# Patient Record
Sex: Male | Born: 1990 | Race: White | Hispanic: No | Smoking: Current every day smoker
Health system: Southern US, Community
[De-identification: ages and names within clinical notes are randomized; demographics above are authoritative.]

## PROBLEM LIST (undated history)

## (undated) DIAGNOSIS — J45909 Unspecified asthma, uncomplicated: Secondary | ICD-10-CM

## (undated) HISTORY — PX: HERNIA REPAIR: SHX51

## (undated) HISTORY — PX: BUNIONECTOMY: SHX129

---
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
## 2016-03-02 DIAGNOSIS — Z139 Encounter for screening, unspecified: Secondary | ICD-10-CM

## 2016-07-03 NOTE — Congregational Nurse Program (Signed)
Congregational Nurse Program Note  Date of Encounter: 03/02/2016  Past Medical History: No past medical history on file.  Encounter Details:  Client was [facility] at [facility] with multiple issues. Client was assisted with appoinment at [facility] to establish PCP, needs physical. Information given to client regarding [facility] to be evaluated. Referral form was done for [facility], client to call if wants an appoinment. Informed [facility] of [facility] 03/06/16.  Hewitt ShortsJan Tamiyah Moulin, RN CNP [phone]

## 2021-07-23 ENCOUNTER — Encounter (HOSPITAL_COMMUNITY): Payer: Self-pay

## 2021-07-23 ENCOUNTER — Emergency Department (HOSPITAL_COMMUNITY): Payer: Self-pay

## 2021-07-23 ENCOUNTER — Emergency Department (HOSPITAL_COMMUNITY)
Admission: EM | Admit: 2021-07-23 | Discharge: 2021-07-23 | Disposition: A | Discharge status: Home / Self Care | Payer: Self-pay | Attending: Emergency Medicine | Admitting: Emergency Medicine

## 2021-07-23 ENCOUNTER — Other Ambulatory Visit: Payer: Self-pay

## 2021-07-23 DIAGNOSIS — F17210 Nicotine dependence, cigarettes, uncomplicated: Secondary | ICD-10-CM | POA: Insufficient documentation

## 2021-07-23 DIAGNOSIS — M545 Low back pain, unspecified: Secondary | ICD-10-CM | POA: Insufficient documentation

## 2021-07-23 DIAGNOSIS — J45909 Unspecified asthma, uncomplicated: Secondary | ICD-10-CM | POA: Insufficient documentation

## 2021-07-23 DIAGNOSIS — M5442 Lumbago with sciatica, left side: Secondary | ICD-10-CM

## 2021-07-23 HISTORY — DX: Unspecified asthma, uncomplicated: J45.909

## 2021-07-23 LAB — URINALYSIS, ROUTINE W REFLEX MICROSCOPIC
Bilirubin Urine: NEGATIVE
Glucose, UA: NEGATIVE mg/dL
Hgb urine dipstick: NEGATIVE
Ketones, ur: NEGATIVE mg/dL
Leukocytes,Ua: NEGATIVE
Nitrite: NEGATIVE
Protein, ur: NEGATIVE mg/dL
Specific Gravity, Urine: 1.024 (ref 1.005–1.030)
pH: 6 (ref 5.0–8.0)

## 2021-07-23 MED ORDER — METHOCARBAMOL 500 MG PO TABS
500.0000 mg | ORAL_TABLET | Freq: Once | ORAL | Status: AC
  Administered 2021-07-23: 500 mg via ORAL
  Filled 2021-07-23: qty 1

## 2021-07-23 MED ORDER — METHOCARBAMOL 500 MG PO TABS: 500.0000 mg | ORAL_TABLET | Freq: Three times a day (TID) | ORAL | 0 refills | Status: AC

## 2021-07-23 MED ORDER — TRAMADOL HCL 50 MG PO TABS: 50.0000 mg | ORAL_TABLET | Freq: Four times a day (QID) | ORAL | 0 refills | Status: AC | PRN

## 2021-07-23 MED ORDER — HYDROCODONE-ACETAMINOPHEN 5-325 MG PO TABS
1.0000 | ORAL_TABLET | Freq: Once | ORAL | Status: AC
  Administered 2021-07-23: 1 via ORAL
  Filled 2021-07-23: qty 1

## 2021-07-23 NOTE — Discharge Instructions (Signed)
Try alternating ice and heat to your lower back.  Avoid heavy lifting, twisting or bending movements for at least 1 week.  Call your primary care provider or you may contact the orthopedic provider or clinic listed above to arrange follow-up.  Return to the emergency department for any new or worsening symptoms.

## 2021-07-23 NOTE — ED Provider Notes (Signed)
[facility] EMERGENCY DEPARTMENT Provider Note   CSN: [id number] Arrival date & time: 07/23/21  1155     History Chief Complaint  Patient presents with   Back Pain    Jeff Cook is a 30 y.o. male.   Back Pain Associated symptoms: no abdominal pain, no chest pain, no dysuria, no fever, no numbness and no weakness         Jeff Cook is a 30 y.o. male who presents to the Emergency Department complaining of low back pain x2 weeks.  He states that he went to [facility] and was treated for low back pain with naproxen and Flexeril.  Initially medications were helping, but now states the pain has worsened.  He describes aching pain across his lower back that radiates into both hips and thighs.  Pain is worse with walking, standing, or certain positions.  Improves at rest.  No known injuries.  He does endorse heavy lifting at work.  He denies fever, chills, urine or bowel changes, abdominal pain, pain to his testicles and numbness or weakness of his lower extremities.  No recent spinal procedures.   Past Medical History:  Diagnosis Date   Asthma     There are no problems to display for this patient.   Past Surgical History:  Procedure Laterality Date   BUNIONECTOMY     twice   HERNIA REPAIR         History reviewed. No pertinent family history.  Social History   Tobacco Use   Smoking status: Every Day    Packs/day: 0.50    Types: Cigarettes   Smokeless tobacco: Never  Vaping Use   Vaping Use: Every day  Substance Use Topics   Alcohol use: Yes   Drug use: Never    Home Medications Prior to Admission medications   Not on File    Allergies    Patient has no known allergies.  Review of Systems   Review of Systems  Constitutional:  Negative for chills, fatigue and fever.  Respiratory:  Negative for cough and shortness of breath.   Cardiovascular:  Negative for chest pain and palpitations.  Gastrointestinal:  Negative for abdominal pain,  constipation, nausea and vomiting.  Genitourinary:  Negative for decreased urine volume, difficulty urinating, dysuria, flank pain, hematuria and testicular pain.  Musculoskeletal:  Positive for back pain. Negative for arthralgias, joint swelling, myalgias, neck pain and neck stiffness.  Skin:  Negative for rash.  Neurological:  Negative for dizziness, weakness and numbness.  Hematological:  Does not bruise/bleed easily.   Physical Exam Updated Vital Signs BP 101/68 (BP Location: Right Arm)   Pulse (!) 102   Temp 99.1 F (37.3 C) (Oral)   Resp 16   Ht 6\' 4"  (1.93 m)   Wt 77.1 kg   SpO2 99%   BMI 20.69 kg/m   Physical Exam Vitals and nursing note reviewed.  Constitutional:      Appearance: Normal appearance.  HENT:     Head: Normocephalic.  Eyes:     Pupils: Pupils are equal, round, and reactive to light.  Neck:     Thyroid: No thyromegaly.     Meningeal: Kernig's sign absent.  Cardiovascular:     Rate and Rhythm: Normal rate and regular rhythm.     Heart sounds: Normal heart sounds.  Pulmonary:     Effort: Pulmonary effort is normal.     Breath sounds: Normal breath sounds. No wheezing.  Abdominal:     Palpations:  Abdomen is soft.     Tenderness: There is no abdominal tenderness. There is no guarding or rebound.  Musculoskeletal:        General: Tenderness present. No swelling. Normal range of motion.     Cervical back: Normal range of motion and neck supple.     Comments: Diffuse tenderness to palpation of the lower lumbar and bilateral paraspinal muscles.  Hip flexors and extensors are intact.  Skin:    General: Skin is warm.     Capillary Refill: Capillary refill takes less than 2 seconds.     Findings: No rash.  Neurological:     Mental Status: He is alert and oriented to person, place, and time.     Sensory: No sensory deficit.     Motor: No weakness.    ED Results / Procedures / Treatments   Labs (all labs ordered are listed, but only abnormal results are  displayed) Labs Reviewed  URINALYSIS, ROUTINE W REFLEX MICROSCOPIC - Abnormal; Notable for the following components:      Result Value   APPearance HAZY (*)    All other components within normal limits    EKG None  Radiology DG Lumbar Spine Complete  Result Date: 07/23/2021 CLINICAL DATA:  Low back pain with pain radiating down left thigh for 2 weeks. No known injury. EXAM: LUMBAR SPINE - COMPLETE 4+ VIEW COMPARISON:  None. FINDINGS: Question 6 non-rib-bearing lumbar vertebral bodies. There is no evidence of lumbar spine fracture. Alignment is normal. Intervertebral disc spaces are maintained. Stool throughout the visualized colon. IMPRESSION: No acute displaced fracture or traumatic listhesis of the lumbar spine. Electronically Signed   By: Tish Frederickson M.D.   On: 07/23/2021 17:21    Procedures Procedures   Medications Ordered in ED Medications - No data to display  ED Course  I have reviewed the triage vital signs and the nursing notes.  Pertinent labs & imaging results that were available during my care of the patient were reviewed by me and considered in my medical decision making (see chart for details).    MDM Rules/Calculators/A&P                          Patient here with 2-week history of low back pain.  No known injury but does endorse heavy lifting at his job.  Pain is worse with position change, standing and walking.  Improves at rest.  No history of drug use or recent procedures to suggest spinal abscess or discitis.  On exam, patient has diffuse tenderness palpation of the lumbar paraspinal muscles.  Able to perform straight leg raise bilaterally.  5 out of 5 strength bilateral lower extremities.  He is ambulatory to the restroom without difficulty.  No significant ataxia.  Urinalysis without evidence of infection.  X-ray without acute bony findings, does show increased amount of stool throughout the colon.  Doubt emergent process, specifically no red flags  suggestive of cauda equina or infectious process.  Discussed x-ray findings and urine result.  Patient appears appropriate for discharge home, agrees to over-the-counter MiraLAX 1-2 times daily until constipation improves and he will follow-up with PCP or orthopedics.  Return precautions were discussed.   Final Clinical Impression(s) / ED Diagnoses Final diagnoses:  Acute bilateral low back pain with bilateral sciatica    Rx / DC Orders ED Discharge Orders     None        Pauline Aus, PA-C 07/23/21 1842  Cheryll Cockayne, MD 08/13/21 [phone]

## 2021-07-23 NOTE — ED Triage Notes (Signed)
Pt to er, pt states that about two weeks ago pt went to [facility] for back pain, states that he was given naproxen and flexeril and he is here because the pain is too bad to go to work [employer].

## 2022-03-05 ENCOUNTER — Emergency Department (HOSPITAL_COMMUNITY): Payer: Self-pay

## 2022-03-05 ENCOUNTER — Other Ambulatory Visit: Payer: Self-pay

## 2022-03-05 ENCOUNTER — Encounter (HOSPITAL_COMMUNITY): Payer: Self-pay | Admitting: Emergency Medicine

## 2022-03-05 ENCOUNTER — Emergency Department (HOSPITAL_COMMUNITY)
Admission: EM | Admit: 2022-03-05 | Discharge: 2022-03-05 | Disposition: A | Discharge status: Home / Self Care | Payer: Self-pay | Attending: Emergency Medicine | Admitting: Emergency Medicine

## 2022-03-05 DIAGNOSIS — R200 Anesthesia of skin: Secondary | ICD-10-CM | POA: Insufficient documentation

## 2022-03-05 DIAGNOSIS — M25551 Pain in right hip: Secondary | ICD-10-CM | POA: Insufficient documentation

## 2022-03-05 LAB — CBG MONITORING, ED: Glucose-Capillary: 89 mg/dL (ref 70–99)

## 2022-03-05 MED ORDER — KETOROLAC TROMETHAMINE 60 MG/2ML IM SOLN
60.0000 mg | Freq: Once | INTRAMUSCULAR | Status: AC
  Administered 2022-03-05: 60 mg via INTRAMUSCULAR
  Filled 2022-03-05: qty 2

## 2022-03-05 NOTE — ED Triage Notes (Signed)
Pt to the ED with bilateral hip for the last month. ? ?Pt states he now has rt leg numbness from his shin area that began in his toes.  ?

## 2022-03-05 NOTE — ED Provider Notes (Signed)
?[facility] EMERGENCY DEPARTMENT ?Provider Note ? ? ?CSN: [id number] ?Arrival date & time: 03/05/22  6948 ? ?  ? ?History ? ?Chief Complaint  ?Patient presents with  ? Leg Pain  ? ? ?Jeff Cook is a 31 y.o. male. ? ? ?Leg Pain ? ?Patient presents with right hip pain times month and a half.  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  He states the right hip pain has been constant, sharp and worse with any ambulation or movement.  No associated saddle anesthesia, bilateral lower extremity weakness, fecal retention or incontinence.  Denies any dysuria or hematuria. ? ?Patient states starting a week ago he started having right lower extremity numbness from the toes to the knee which is constant.  He states he has decreased sensation, he still feels pressure.  No pain to the calf, no shortness of breath or chest pain, no history of DVTs. ? ?No previous spinal surgeries.  He is not a known diabetic.  Denies any history of IV drug use or immunosuppression therapy. ? ?Home Medications ?Prior to Admission medications   ?Medication Sig Start Date End Date Taking? Authorizing Provider  ?methocarbamol (ROBAXIN) 500 MG tablet Take 1 tablet (500 mg total) by mouth 3 (three) times daily. ?Patient not taking: Reported on 03/05/2022 07/23/21   Pauline Aus, PA-C  ?traMADol (ULTRAM) 50 MG tablet Take 1 tablet (50 mg total) by mouth every 6 (six) hours as needed. ?Patient not taking: Reported on 03/05/2022 07/23/21   Pauline Aus, PA-C  ?   ? ?Allergies    ?Patient has no known allergies.   ? ?Review of Systems   ?Review of Systems ? ?Physical Exam ?Updated Vital Signs ?BP 119/84   Pulse 95   Temp 97.8 ?F (36.6 ?C) (Oral)   Resp 16   Ht 6\' 4"  (1.93 m)   Wt 77.1 kg   SpO2 100%   BMI 20.69 kg/m?  ?Physical Exam ?Vitals and nursing note reviewed. Exam conducted with a chaperone present.  ?Constitutional:   ?   General: He is not in acute distress. ?   Appearance:  Normal appearance.  ?HENT:  ?   Head: Normocephalic and atraumatic.  ?Eyes:  ?   General: No scleral icterus. ?   Extraocular Movements: Extraocular movements intact.  ?   Pupils: Pupils are equal, round, and reactive to light.  ?Cardiovascular:  ?   Pulses: Normal pulses.  ?Musculoskeletal:     ?   General: Tenderness present.  ?   Comments: Point tenderness to the right hip and decreased ROM with pain with straight leg extension.  No midline lumbar tenderness, no paraspinal tenderness.  ?Skin: ?   Capillary Refill: Capillary refill takes less than 2 seconds.  ?   Coloration: Skin is not jaundiced.  ?   Comments: No erythema or swelling to the lower extremities.  ?Neurological:  ?   Mental Status: He is alert. Mental status is at baseline.  ?   Coordination: Coordination normal.  ?   Comments: Cranial nerves III through XII are grossly intact.  Able to raise both lower extremities, plantarflexion and dorsiflexion 5/5 strength.  Patellar and Achilles reflexes fully intact.  ? ? ?ED Results / Procedures / Treatments   ?Labs ?(all labs ordered are listed, but only abnormal results are displayed) ?Labs Reviewed  ?CBG MONITORING, ED  ? ? ?EKG ?None ? ?Radiology ?CT Hip Right Wo Contrast ? ?Result Date:  03/05/2022 ?CLINICAL DATA:  Right hip pain for 1 month.  No known injury. EXAM: CT OF THE RIGHT HIP WITHOUT CONTRAST TECHNIQUE: Multidetector CT imaging of the right hip was performed according to the standard protocol. Multiplanar CT image reconstructions were also generated. RADIATION DOSE REDUCTION: This exam was performed according to the departmental dose-optimization program which includes automated exposure control, adjustment of the mA and/or kV according to patient size and/or use of iterative reconstruction technique. COMPARISON:  None. FINDINGS: Bones/Joint/Cartilage No acute fracture. No dislocation. No focal cortical thickening or periosteal elevation. Right hip joint space is preserved. No hip joint effusion.  No evidence of femoral head avascular necrosis. No lytic or sclerotic bone lesion. Included portion of the right hemipelvis is intact. Pubic symphysis and right sacroiliac joint within normal limits. Ligaments Suboptimally assessed by CT. Muscles and Tendons Musculotendinous structures appear within normal limits by CT. Soft tissues No soft tissue swelling or fluid collection. No right inguinal lymphadenopathy. No acute findings within the visualized right hemipelvis. Normal appendix is seen. IMPRESSION: Negative CT of the right hip. Electronically Signed   By: Davina Poke D.O.   On: 03/05/2022 11:36   ? ?Procedures ?Procedures  ? ? ?Medications Ordered in ED ?Medications  ?ketorolac (TORADOL) injection 60 mg (has no administration in time range)  ? ? ?ED Course/ Medical Decision Making/ A&P ?  ?                        ?Medical Decision Making ?Amount and/or Complexity of Data Reviewed ?Radiology: ordered. ? ?Risk ?Prescription drug management. ? ? ?Patient presents with right hip pain and right lower extremity numbness.  Physical exam is overall reassuring, his vitals are stable.  He is not febrile, he is neurovascularly intact with equal reflexes to patellar and Achilles.  I considered possible DVT but there is no lower extremity tenderness, swelling, erythema.  Although he does have subjective numbness, he has full sensation on exam.  There are no focal deficits, he is able to raise both lower extremities without any difficulties.  I considered septic joint but feel that this is unlikely.  Considered cauda equina but in the absence of trauma also doubt.  Considered epidural abscess but patient does not have a history of IV drug use or immunocompromise state, additionally he is not febrile and his neuro exam is overall unremarkable.  Considered neuropathy, patient is not diabetic and I checked a CBG which showed sugar of 89. ? ?Patient presentation is not consistent with sciatica given the numbness is from  toe to calf.  He has point tenderness to the right hip so I did proceed with CT given patient does not have outpatient follow-up easily available and he has had a long chronicity of the symptoms with subjective worsening. ? ?I viewed the CT image of his right hip and agree with radiologist interpretation.  Do not see any acute process.  I ordered the patient some Toradol which helped improve his pain.  He said this was likely musculoskeletal, although he does have some subjective numbness it does not seem consistent with paresthesias, there is no difficulty with strength, objective sensation on exam, DP or PT or reflexes.  Do not think he needs any additional work-up at this time.  We will provide referral for orthopedic follow-up as needed outpatient. ? ? ? ? ? ? ? ?Final Clinical Impression(s) / ED Diagnoses ?Final diagnoses:  ?Pain of right hip  ? ? ?Rx / DC  Orders ?ED Discharge Orders   ? ? None  ? ?  ? ? ?  ?Sherrill Raring, PA-C ?03/05/22 1146 ? ?  ?Milton Ferguson, MD ?03/05/22 1727 ? ?

## 2022-03-05 NOTE — Discharge Instructions (Addendum)
Work-up today was reassuring.  You are not diabetic.  Follow-up with orthopedic surgery for reevaluation if symptoms persist.   ? ?Return to the ED if you are unable to urinate or have bowel movements, you have bilateral weakness, or you lose sensation in your pelvic area. ?

## 2022-04-08 ENCOUNTER — Ambulatory Visit (INDEPENDENT_AMBULATORY_CARE_PROVIDER_SITE_OTHER): Payer: Self-pay

## 2022-04-08 ENCOUNTER — Ambulatory Visit (INDEPENDENT_AMBULATORY_CARE_PROVIDER_SITE_OTHER): Payer: Self-pay | Admitting: Orthopaedic Surgery

## 2022-04-08 ENCOUNTER — Encounter: Payer: Self-pay | Admitting: Orthopaedic Surgery

## 2022-04-08 VITALS — Ht 76.0 in | Wt 170.0 lb

## 2022-04-08 DIAGNOSIS — G8929 Other chronic pain: Secondary | ICD-10-CM

## 2022-04-08 DIAGNOSIS — M5441 Lumbago with sciatica, right side: Secondary | ICD-10-CM

## 2022-04-08 DIAGNOSIS — M5440 Lumbago with sciatica, unspecified side: Secondary | ICD-10-CM

## 2022-04-08 DIAGNOSIS — M545 Low back pain, unspecified: Secondary | ICD-10-CM | POA: Insufficient documentation

## 2022-04-08 MED ORDER — METHYLPREDNISOLONE 4 MG PO TBPK: ORAL_TABLET | ORAL | 0 refills | Status: AC

## 2022-04-08 NOTE — Progress Notes (Signed)
Office Visit Note   Patient: Jeff Cook           Date of Birth: [birth year]           MRN: [id number] Visit Date: 04/08/2022              Requested by: No referring provider defined for this encounter. PCP: Pcp, No   Assessment & Plan: Visit Diagnoses:  1. Chronic right-sided low back pain with right-sided sciatica   2. Acute right-sided low back pain with sciatica, sciatica laterality unspecified     Plan: Patient is a pleasant 31 year old gentleman with a 21-month history of right leg pain that radiates down to his ankle.  He denies any injury.  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  He says the tables are low compared to his height.  He thinks this may have caused aggravation.  The pain starts in his right buttock and radiates down the back of his leg to his foot with some toe numbness and calf numbness.  He has been taking 12 ibuprofen a day which helps a little bit.  No pain with coughing or sneezing.  Findings consistent with radicular lower back symptoms going down his right side.  Certainly the definitive test would be to get an MRI but this may be difficult as he does not have any insurance.  We will try a course of Medrol Dosepak he was told not to take the ibuprofen with this also have given him a home exercise program.  If he does not get long-lasting relief we would again recommend an MRI  Follow-Up Instructions: No follow-ups on file.   Orders:  Orders Placed This Encounter  Procedures   XR Lumbar Spine 2-3 Views   Meds ordered this encounter  Medications   methylPREDNISolone (MEDROL DOSEPAK) 4 MG TBPK tablet    Sig: Take as directed on package    Dispense:  21 tablet    Refill:  0      Procedures: No procedures performed   Clinical Data: No additional findings.   Subjective: Chief Complaint  Patient presents with   Lower Back - Pain  Patient presents today for right leg pain. He said that it started back in  February of this year. No known injury. He has pain that originates in his buttock area and radiates down the backside of his leg to his foot. He has numbness in his calf and toes. He states that he feels better with lying down and has no trouble sleeping at night. He has increased pain with any movement. He is taking Ibuprofen and states that it helps.     Review of Systems  All other systems reviewed and are negative.   Objective: Vital Signs: Ht 6\' 4"  (1.93 m)   Wt 170 lb (77.1 kg)   BMI 20.69 kg/m   Physical Exam Pulmonary:     Effort: Pulmonary effort is normal.  Skin:    General: Skin is warm and dry.  Neurological:     Mental Status: He is alert.  Psychiatric:        Mood and Affect: Mood normal.        Behavior: Behavior normal.    Ortho Exam Examination of his lower back he has 5 out of 5 strength bilaterally with dorsiflexion plantarflexion of his ankles resisted flexion of his hips resisted extension and flexion of his legs.  He has a  negative straight leg raise bilaterally..  Deep tendon reflexes are 2+ and equivalent to the left side. Specialty Comments:  No specialty comments available.  Imaging: No results found.   PMFS History: Patient Active Problem List   Diagnosis Date Noted   Low back pain 04/08/2022   Past Medical History:  Diagnosis Date   Asthma     History reviewed. No pertinent family history.  Past Surgical History:  Procedure Laterality Date   BUNIONECTOMY     twice   HERNIA REPAIR     Social History   Occupational History   Not on file  Tobacco Use   Smoking status: Every Day    Packs/day: 0.50    Types: Cigarettes   Smokeless tobacco: Never  Vaping Use   Vaping Use: Every day  Substance and Sexual Activity   Alcohol use: Yes    Alcohol/week: 1.0 standard drink    Types: 1 Shots of liquor per week    Comment: occionally   Drug use: Never   Sexual activity: Not on file

## 2022-10-03 IMAGING — CT CT HIP*R* W/O CM
2 of 5 series · 16 of 46 positions shown, 19 images · non-contrast
Comparison: None.

CLINICAL DATA: Right hip pain for 1 month.  No known injury.

EXAM:
CT OF THE RIGHT HIP WITHOUT CONTRAST
TECHNIQUE: Multidetector CT imaging of the right hip was performed according to
the standard protocol. Multiplanar CT image reconstructions were
also generated.
RADIATION DOSE REDUCTION: This exam was performed according to the
departmental dose-optimization program which includes automated
exposure control, adjustment of the mA and/or kV according to
patient size and/or use of iterative reconstruction technique.

[Series 5: 3 axial soft · axial · 0.49mm/px · z∈[+881,+1081]mm · 13 of 112 slices shown, 16 images]
[im 8/112  soft-tissue]
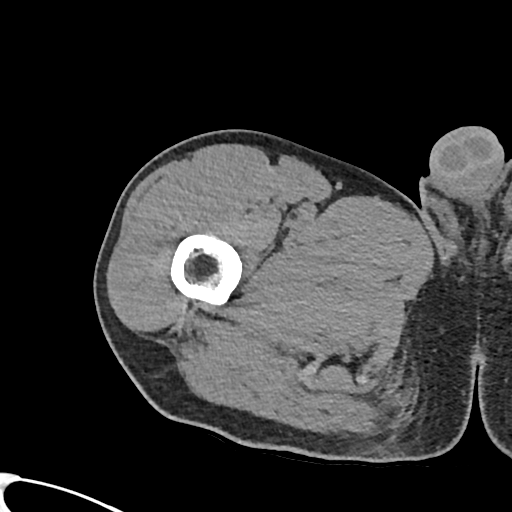
[im 8/112  bone]
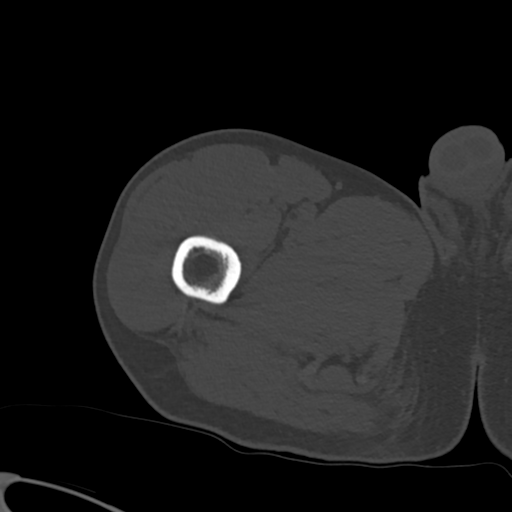
[im 18/112  soft-tissue]
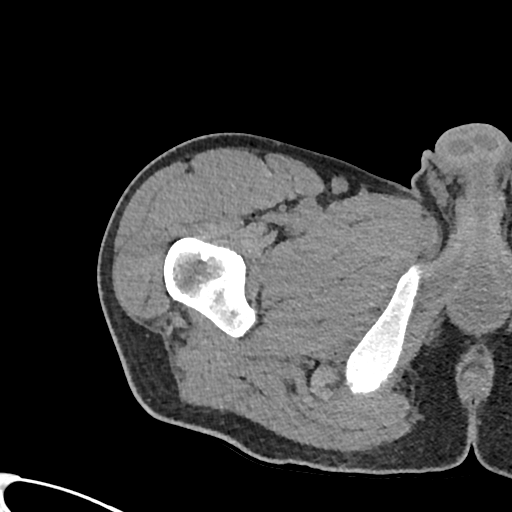
[im 29/112  soft-tissue]
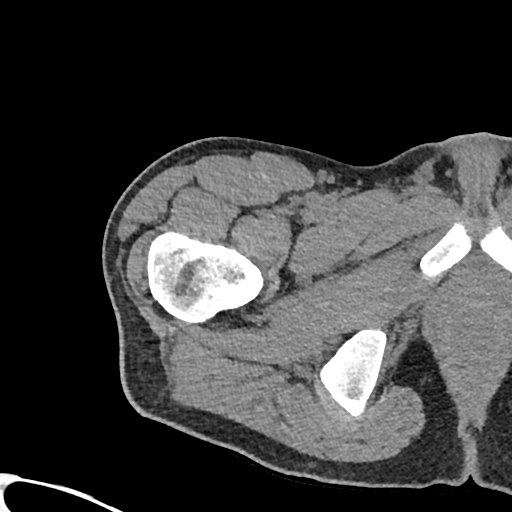
[im 40/112  soft-tissue]
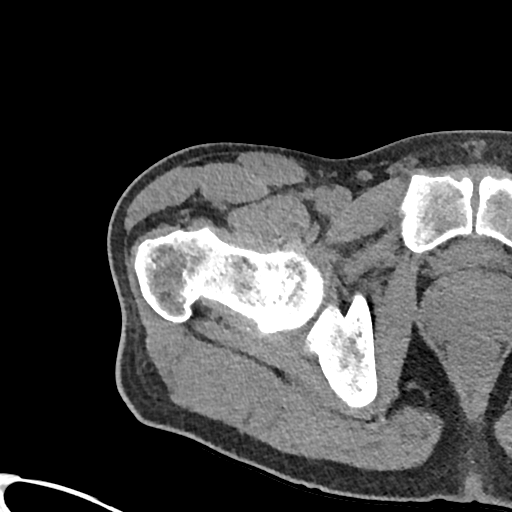
[im 51/112  soft-tissue]
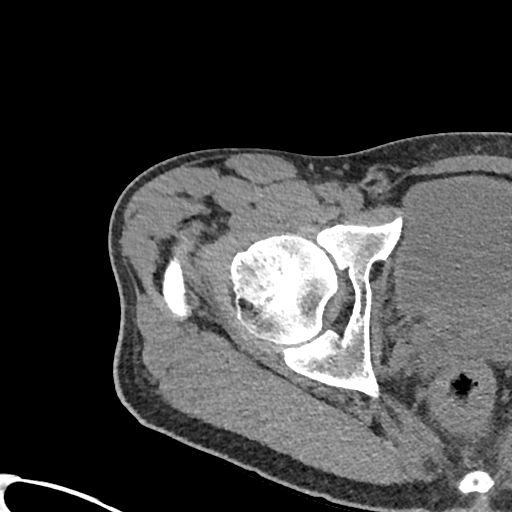
[im 61/112  soft-tissue]
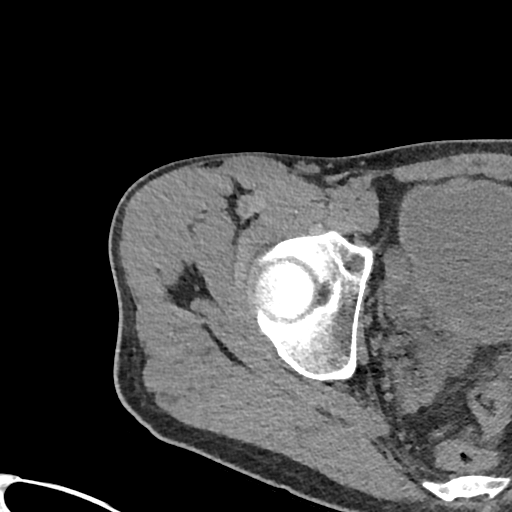
[im 72/112  soft-tissue]
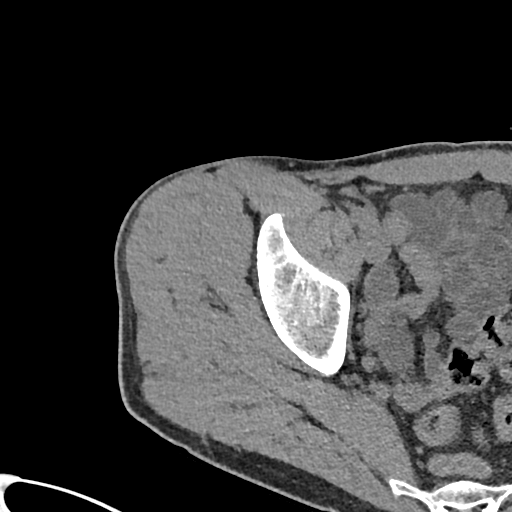
[im 83/112  soft-tissue]
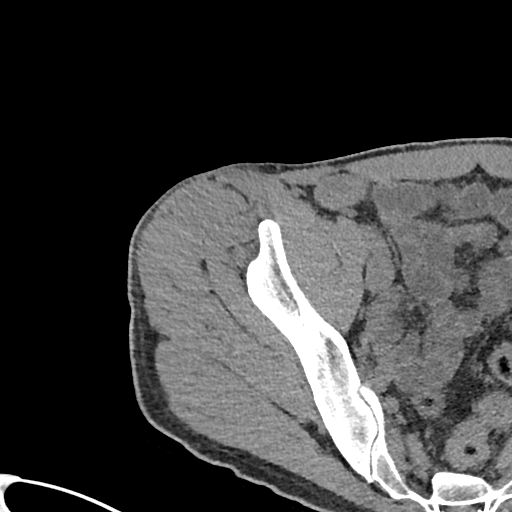
[im 94/112  soft-tissue]
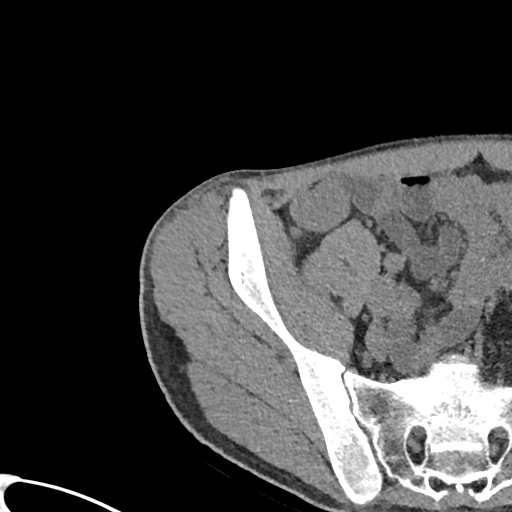
[im 94/112  bone]
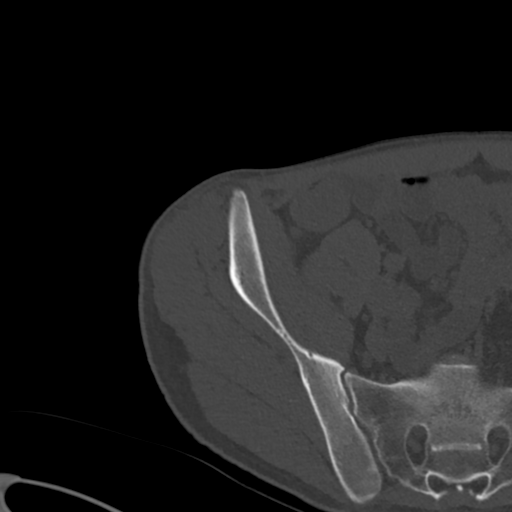
[im 97/112  lung]
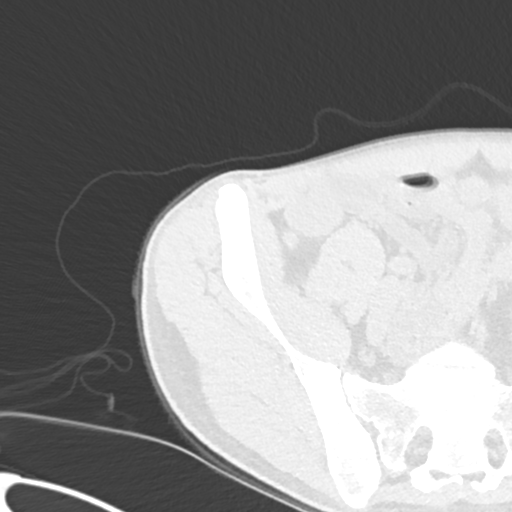
[im 101/112  lung]
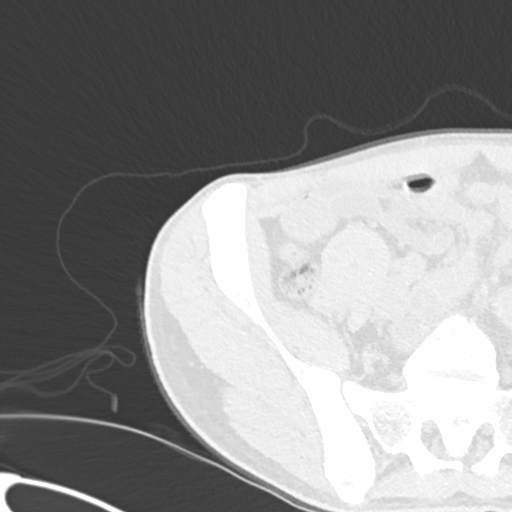
[im 104/112  soft-tissue]
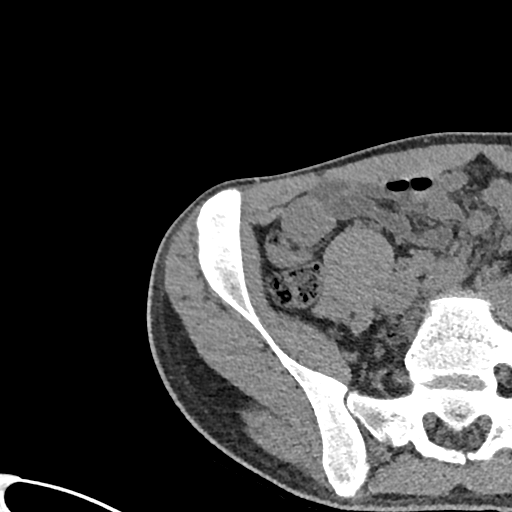
[im 104/112  lung]
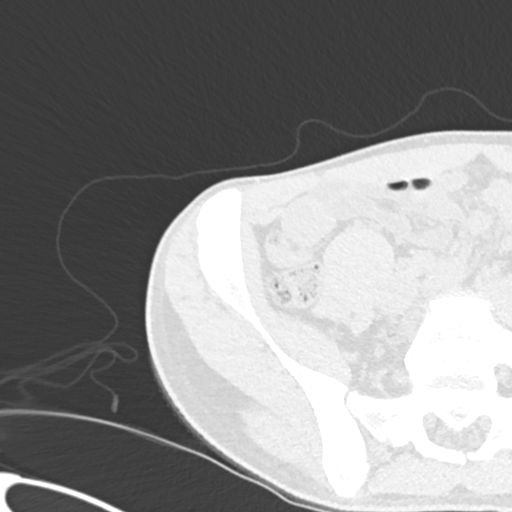
[im 108/112  lung]
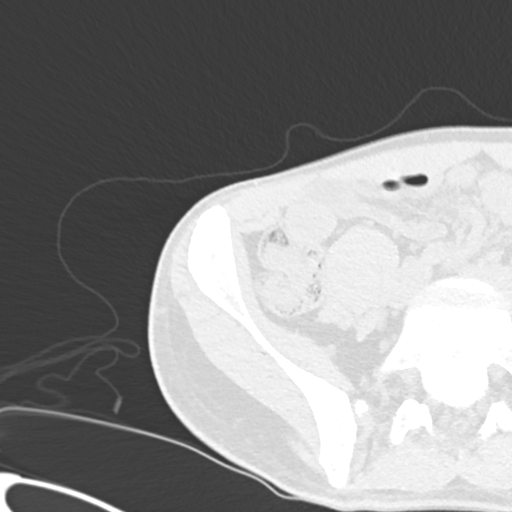

[Series 8: coronal st · coronal · 0.46mm/px · 3 of 103 slices shown]
[im 26/103  soft-tissue]
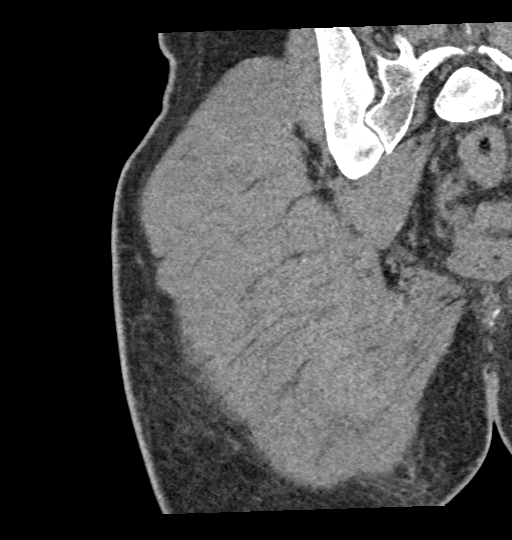
[im 52/103  soft-tissue]
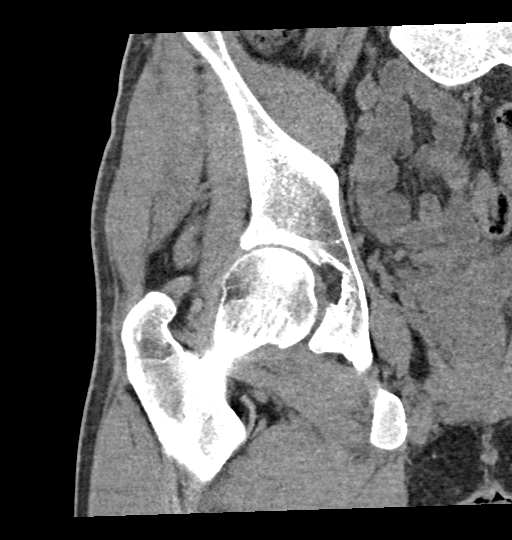
[im 77/103  soft-tissue]
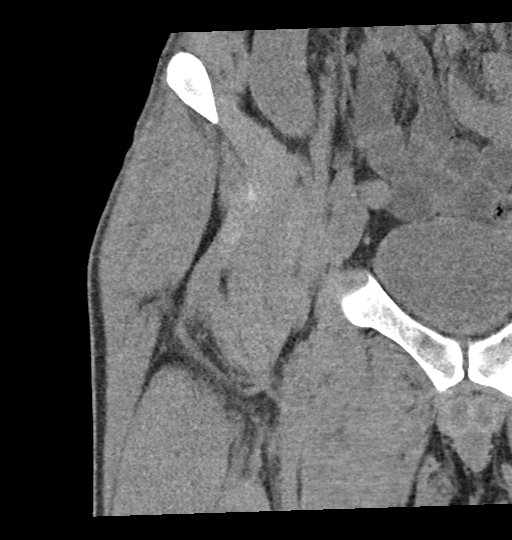

[16 of 46 positions shown; findings below may reference images not displayed]

FINDINGS: Bones/Joint/Cartilage

No acute fracture. No dislocation. No focal cortical thickening or
periosteal elevation. Right hip joint space is preserved. No hip
joint effusion. No evidence of femoral head avascular necrosis. No
lytic or sclerotic bone lesion. Included portion of the right
hemipelvis is intact. Pubic symphysis and right sacroiliac joint
within normal limits.

Ligaments

Suboptimally assessed by CT.

Muscles and Tendons

Musculotendinous structures appear within normal limits by CT.

Soft tissues

No soft tissue swelling or fluid collection. No right inguinal
lymphadenopathy. No acute findings within the visualized right
hemipelvis. Normal appendix is seen.
IMPRESSION: Negative CT of the right hip.
